# Patient Record
Sex: Male | Born: 1950 | Marital: Married | Smoking: Former smoker
Health system: Southern US, Community
[De-identification: ages and names within clinical notes are randomized; demographics above are authoritative.]

## PROBLEM LIST (undated history)

## (undated) DIAGNOSIS — I8290 Acute embolism and thrombosis of unspecified vein: Secondary | ICD-10-CM

## (undated) DIAGNOSIS — J449 Chronic obstructive pulmonary disease, unspecified: Secondary | ICD-10-CM

## (undated) HISTORY — PX: HERNIA REPAIR: SHX51

---

## 2004-02-23 ENCOUNTER — Ambulatory Visit: Payer: Self-pay | Admitting: Gastroenterology

## 2007-03-02 ENCOUNTER — Ambulatory Visit: Payer: Self-pay | Admitting: Gastroenterology

## 2008-02-25 ENCOUNTER — Emergency Department: Payer: Self-pay | Admitting: Emergency Medicine

## 2015-10-23 ENCOUNTER — Encounter: Payer: Self-pay | Admitting: Emergency Medicine

## 2015-10-23 ENCOUNTER — Ambulatory Visit (INDEPENDENT_AMBULATORY_CARE_PROVIDER_SITE_OTHER): Payer: BLUE CROSS/BLUE SHIELD

## 2015-10-23 ENCOUNTER — Ambulatory Visit
Admission: EM | Admit: 2015-10-23 | Discharge: 2015-10-23 | Disposition: A | Discharge status: Home / Self Care | Payer: BLUE CROSS/BLUE SHIELD | Attending: Family Medicine | Admitting: Family Medicine

## 2015-10-23 DIAGNOSIS — S61219A Laceration without foreign body of unspecified finger without damage to nail, initial encounter: Secondary | ICD-10-CM | POA: Diagnosis not present

## 2015-10-23 DIAGNOSIS — S6710XA Crushing injury of unspecified finger(s), initial encounter: Secondary | ICD-10-CM

## 2015-10-23 HISTORY — DX: Acute embolism and thrombosis of unspecified vein: I82.90

## 2015-10-23 HISTORY — DX: Chronic obstructive pulmonary disease, unspecified: J44.9

## 2015-10-23 MED ORDER — MUPIROCIN 2 % EX OINT: 1.0000 "application " | TOPICAL_OINTMENT | Freq: Three times a day (TID) | CUTANEOUS | 0 refills | Status: DC

## 2015-10-23 MED ORDER — TETANUS-DIPHTH-ACELL PERTUSSIS 5-2.5-18.5 LF-MCG/0.5 IM SUSP
0.5000 mL | Freq: Once | INTRAMUSCULAR | Status: AC
  Administered 2015-10-23: 0.5 mL via INTRAMUSCULAR

## 2015-10-23 NOTE — ED Triage Notes (Signed)
Patient states that a piece of wood and metal cut his left 4th finger. Patient unsure if any piece of wood or metal is up under the skin.  Patient has a laceration to his left 4th finger with minimal bleeding at this time.

## 2015-10-23 NOTE — ED Provider Notes (Signed)
CSN: [id number]     Arrival date & time 10/23/15  1815 History   None    Chief Complaint  Patient presents with  . Finger Injury   (Consider location/radiation/quality/duration/timing/severity/associated sxs/prior Treatment) HPI  This 65 year old right-hand-dominant malewith a crush injury laceration to his fourth still fingertip over the pad. States that it bled very briskly prompted his coming in. He questioned on a cabinet between the truck bed in the cabinet itself. He is not current on his tetanus toxoid     Past Medical History:  Diagnosis Date  . Blood clot in vein   . COPD (chronic obstructive pulmonary disease) (HCC)    Past Surgical History:  Procedure Laterality Date  . HERNIA REPAIR     History reviewed. No pertinent family history. Social History  Substance Use Topics  . Smoking status: Former Games developer  . Smokeless tobacco: Never Used  . Alcohol use No    Review of Systems  Constitutional: Positive for activity change. Negative for appetite change, chills, fatigue and fever.  Skin: Positive for wound. Negative for color change and pallor.  All other systems reviewed and are negative.   Allergies  Review of patient's allergies indicates no known allergies.  Home Medications   Prior to Admission medications   Medication Sig Start Date End Date Taking? Authorizing Provider  albuterol (PROVENTIL HFA;VENTOLIN HFA) 108 (90 Base) MCG/ACT inhaler Inhale 2 puffs into the lungs every 4 (four) hours as needed for wheezing or shortness of breath.   Yes Historical Provider, MD  apixaban (ELIQUIS) 5 MG TABS tablet Take 5 mg by mouth 2 (two) times daily.   Yes Historical Provider, MD  Fluticasone-Salmeterol (ADVAIR) 250-50 MCG/DOSE AEPB Inhale 1 puff into the lungs 2 (two) times daily.   Yes Historical Provider, MD  gabapentin (NEURONTIN) 100 MG capsule Take 100 mg by mouth 3 (three) times daily.   Yes Historical Provider, MD  ipratropium (ATROVENT) 0.02 % nebulizer  solution Take 0.5 mg by nebulization every 4 (four) hours as needed for wheezing or shortness of breath.   Yes Historical Provider, MD  nicotine (NICODERM CQ - DOSED IN MG/24 HOURS) 21 mg/24hr patch Place 21 mg onto the skin daily.   Yes Historical Provider, MD  omeprazole (PRILOSEC) 20 MG capsule Take 20 mg by mouth daily.   Yes Historical Provider, MD  tiotropium (SPIRIVA) 18 MCG inhalation capsule Place 18 mcg into inhaler and inhale daily.   Yes Historical Provider, MD  valACYclovir (VALTREX) 1000 MG tablet Take 1,000 mg by mouth 3 (three) times daily.   Yes Historical Provider, MD  mupirocin ointment (BACTROBAN) 2 % Apply 1 application topically 3 (three) times daily. 10/23/15   Lutricia Feil, PA-C   Meds Ordered and Administered this Visit   Medications  Tdap (BOOSTRIX) injection 0.5 mL (0.5 mLs Intramuscular Given 10/23/15 1939)    BP 136/85 (BP Location: Right Arm)   Pulse 90   Temp 97.5 F (36.4 C) (Tympanic)   Resp 16   Ht 5\' 3"  (1.6 m)   Wt 171 lb (77.6 kg)   SpO2 97%   BMI 30.29 kg/m  No data found.   Physical Exam  Constitutional: He is oriented to person, place, and time. He appears well-developed and well-nourished. No distress.  HENT:  Head: Normocephalic and atraumatic.  Eyes: EOM are normal. Pupils are equal, round, and reactive to light.  Neck: Normal range of motion. Neck supple.  Musculoskeletal: Normal range of motion. He exhibits edema and tenderness. He  exhibits no deformity.  Neurological: He is alert and oriented to person, place, and time.  Skin: Skin is warm and dry. He is not diaphoretic.  Examination of the left dominant ring finger volar pad is a 2 cm laceration in V configuration with the apex proximal. It is skived with the deepest proximal and the flap ending distally. The flap is anchored distally and the viability is questionable but will make a good bio dressing. Flexor tendons are strong and intact. Proximal end is in subcutaneous tissue  gradually elevates to epidermis.  Nursing note and vitals reviewed.   Urgent Care Course   Clinical Course    .Marland Kitchen.Laceration Repair Date/Time: 10/23/2015 8:47 PM Performed by: Lutricia FeilOEMER, WILLIAM P Authorized by: Hassan RowanWADE, EUGENE   Consent:    Consent obtained:  Verbal   Consent given by:  Patient   Risks discussed:  Infection, retained foreign body and poor wound healing   Alternatives discussed:  No treatment Anesthesia (see MAR for exact dosages):    Anesthesia method:  Local infiltration   Local anesthetic:  Lidocaine 1% w/o epi Laceration details:    Location:  Finger   Finger location:  L ring finger   Length (cm):  2   Depth (mm):  2 Repair type:    Repair type:  Simple Pre-procedure details:    Preparation:  Patient was prepped and draped in usual sterile fashion Exploration:    Hemostasis achieved with:  Direct pressure   Wound exploration: wound explored through full range of motion and entire depth of wound probed and visualized     Contaminated: no   Treatment:    Area cleansed with:  Saline   Amount of cleaning:  Extensive   Irrigation solution:  Sterile saline   Irrigation volume:  30 cc   Irrigation method:  Pressure wash   Visualized foreign bodies/material removed: no   Skin repair:    Repair method:  Sutures   Suture size:  4-0   Suture material:  Nylon   Suture technique:  Simple interrupted   Number of sutures:  5 Approximation:    Approximation:  Loose   Vermilion border: well-aligned   Post-procedure details:    Dressing:  Non-adherent dressing, sterile dressing and splint for protection   Patient tolerance of procedure:  Tolerated well, no immediate complications Comments:     She will elevate the hand tonight above his heart. He'll keep the area clean and dry for 24 hours. After 24 hours began a washing program 3 times a day drying thoroughly and applying Bactroban ointment. Symptoms of infection were reviewed with the patient. We'll plan on  removing the sutures in 10 days.   (including critical care time)  Labs Review Labs Reviewed - No data to display  Imaging Review Dg Finger Ring Left  Result Date: 10/23/2015 CLINICAL DATA:  Fourth digit laceration tonight. EXAM: LEFT RING FINGER 2+V COMPARISON:  None. FINDINGS: The joint spaces are maintained. No acute fracture or radiopaque foreign body. Moderate soft tissue swelling involving the distal aspect of the finger. Wood can be extremely difficult to see on plain films. IMPRESSION: No fracture or radiopaque foreign body. Electronically Signed   By: Rudie MeyerP.  Gallerani M.D.   On: 10/23/2015 19:54     Visual Acuity Review  Right Eye Distance:   Left Eye Distance:   Bilateral Distance:    Right Eye Near:   Left Eye Near:    Bilateral Near:    Medications  Tdap (BOOSTRIX) injection 0.5  mL (0.5 mLs Intramuscular Given 10/23/15 1939)       MDM   1. Finger laceration, initial encounter   2. Crush injury to finger, initial encounter    He was fully instructed in signs and symptoms of impending infection. Keep the wound dry for 24 hours. Afterwards he will start a washing program 3 times a day with applying Bactroban ointment 3 times daily until the sutures come out in 10 days. Discharge Medication List as of 10/23/2015  8:38 PM       Lutricia Feil, PA-C 10/23/15 2045    Lutricia Feil, PA-C 10/23/15 2052

## 2015-11-03 ENCOUNTER — Ambulatory Visit
Admission: EM | Admit: 2015-11-03 | Discharge: 2015-11-03 | Disposition: A | Discharge status: Home / Self Care | Payer: BLUE CROSS/BLUE SHIELD

## 2015-11-03 ENCOUNTER — Encounter: Payer: Self-pay | Admitting: *Deleted

## 2015-11-03 DIAGNOSIS — Z4802 Encounter for removal of sutures: Secondary | ICD-10-CM

## 2015-11-03 NOTE — ED Triage Notes (Signed)
Suture removal, 5 sutures removed, no swelling, redness, or drainage observed.

## 2017-09-29 IMAGING — CR DG FINGER RING 2+V*L*
3 series · 3 of 3 positions shown · non-contrast
Comparison: None.

CLINICAL DATA: Fourth digit laceration tonight.

EXAM:
LEFT RING FINGER 2+V

[finger ap]
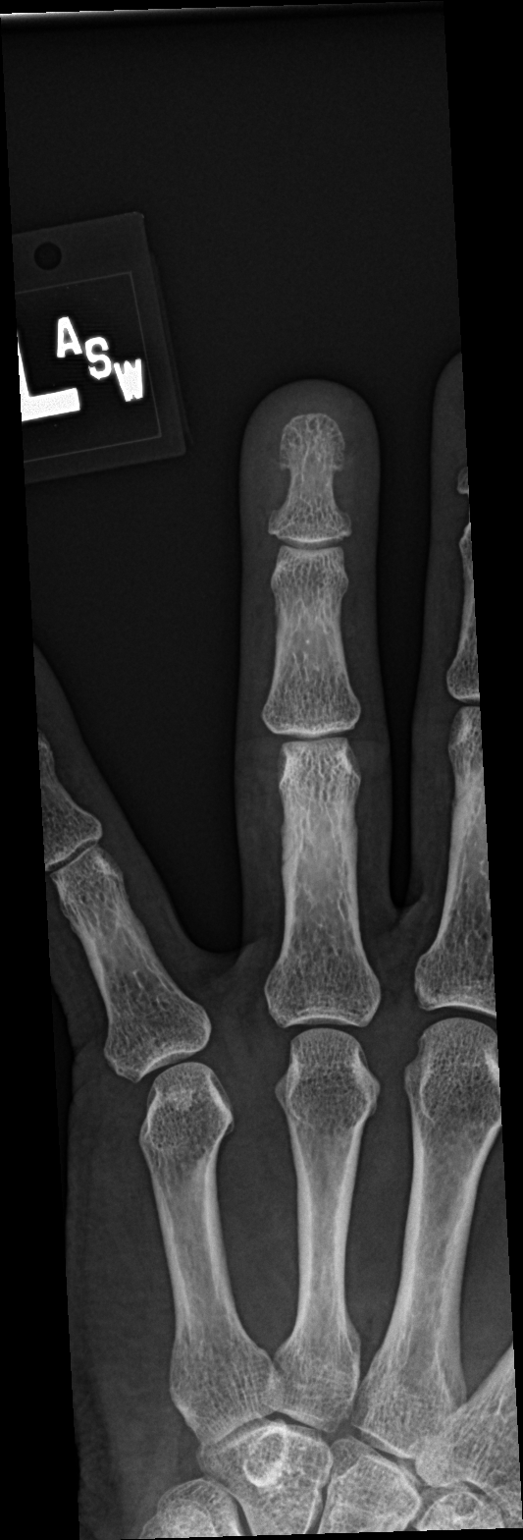

[finger obl]
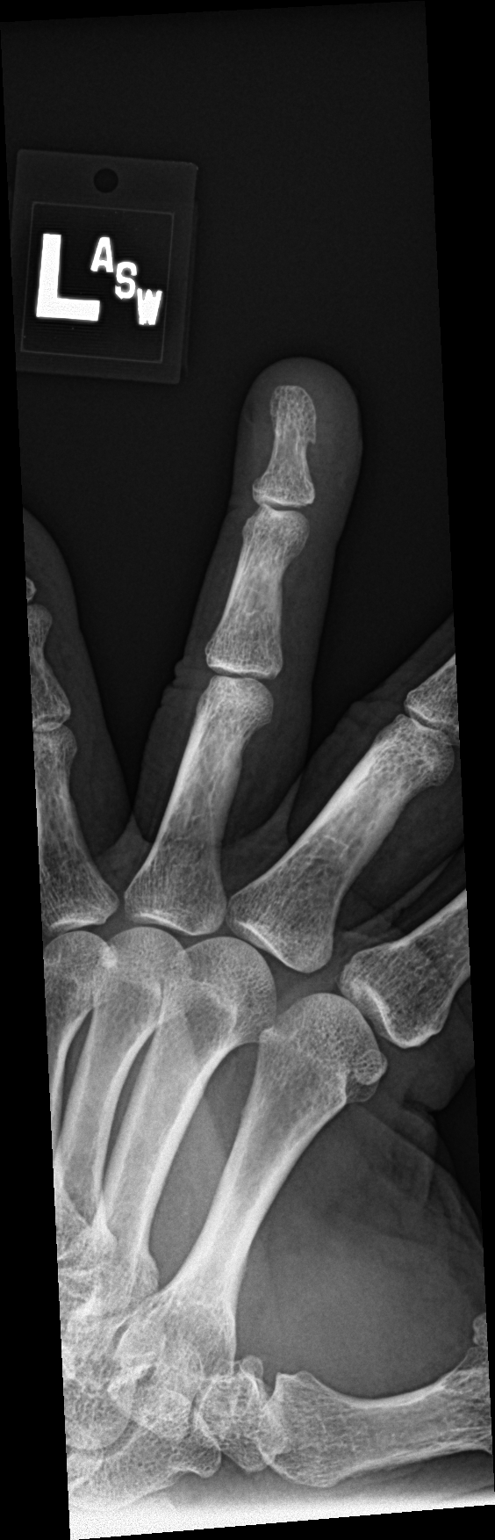

[finger lat]
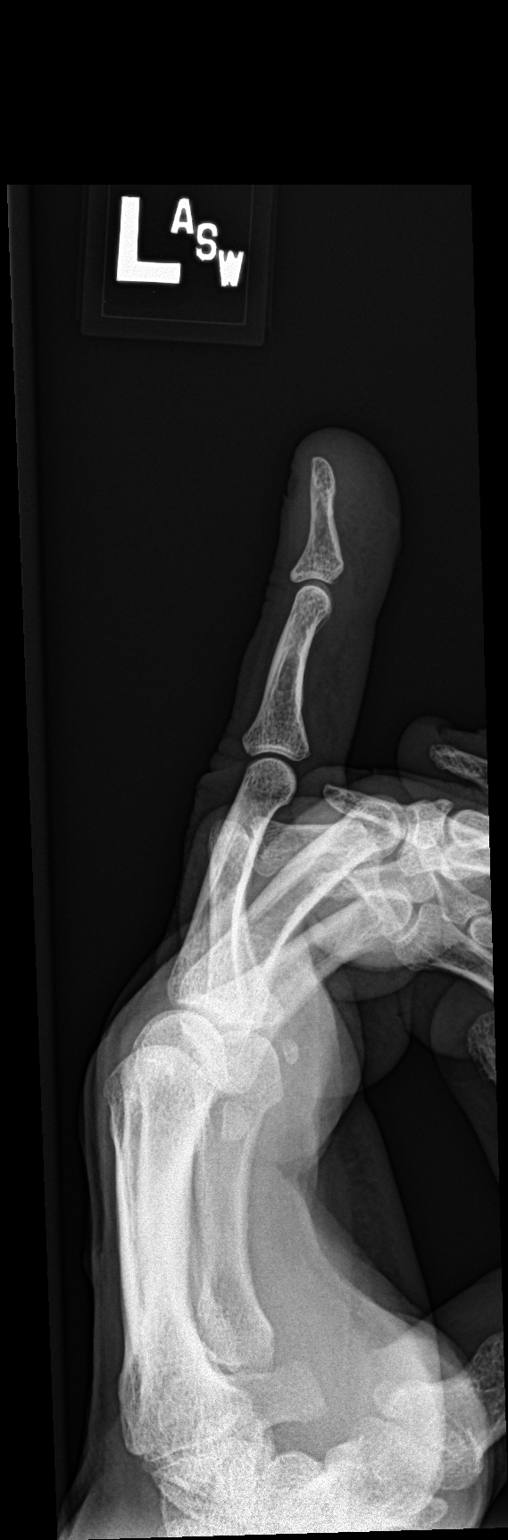

[3 of 3 positions shown; findings below may reference images not displayed]

FINDINGS: The joint spaces are maintained. No acute fracture or radiopaque
foreign body. Moderate soft tissue swelling involving the distal
aspect of the finger. Hary can be extremely difficult to see on
plain films.
IMPRESSION: No fracture or radiopaque foreign body.

## 2020-03-20 ENCOUNTER — Other Ambulatory Visit: Payer: Self-pay

## 2020-03-20 ENCOUNTER — Ambulatory Visit
Admission: EM | Admit: 2020-03-20 | Discharge: 2020-03-20 | Disposition: A | Discharge status: Home / Self Care | Payer: Medicare Other | Attending: Family Medicine | Admitting: Family Medicine

## 2020-03-20 DIAGNOSIS — L03011 Cellulitis of right finger: Secondary | ICD-10-CM

## 2020-03-20 MED ORDER — MUPIROCIN 2 % EX OINT: 1.0000 "application " | TOPICAL_OINTMENT | Freq: Three times a day (TID) | CUTANEOUS | 0 refills | Status: AC

## 2020-03-20 MED ORDER — DOXYCYCLINE HYCLATE 100 MG PO CAPS: 100.0000 mg | ORAL_CAPSULE | Freq: Two times a day (BID) | ORAL | 0 refills | Status: DC

## 2020-03-20 NOTE — ED Provider Notes (Signed)
[facility]    CSN: [id number] Arrival date & time: 03/20/20  1659      History   Chief Complaint Chief Complaint  Patient presents with  . Hand Pain    Right index finger   HPI   70 year old male presents with the above complaint.  Patient reports a 1 month history of pain and redness around his nail bed.  It does not seem to be resolving.  Is not bothersome for him until it is touched or he hits it on something.  It is red and swollen.  Affects the right index finger.  Patient does bite his nails.  This is the likely inciting factor.  No other complaints.   Past Medical History:  Diagnosis Date  . Blood clot in vein   . COPD (chronic obstructive pulmonary disease) (HCC)    Past Surgical History:  Procedure Laterality Date  . HERNIA REPAIR     Home Medications    Prior to Admission medications   Medication Sig Start Date End Date Taking? Authorizing Provider  albuterol (PROVENTIL HFA;VENTOLIN HFA) 108 (90 Base) MCG/ACT inhaler Inhale 2 puffs into the lungs every 4 (four) hours as needed for wheezing or shortness of breath.   Yes [provider]  apixaban (ELIQUIS) 5 MG TABS tablet Take 5 mg by mouth 2 (two) times daily.   Yes [provider]  doxycycline (VIBRAMYCIN) 100 MG capsule Take 1 capsule (100 mg total) by mouth 2 (two) times daily. 03/20/20  Yes Jamauri Kruzel G, DO  Fluticasone-Salmeterol (ADVAIR) 250-50 MCG/DOSE AEPB Inhale 1 puff into the lungs 2 (two) times daily.   Yes [provider]  gabapentin (NEURONTIN) 100 MG capsule Take 100 mg by mouth 3 (three) times daily.   Yes [provider]  ipratropium (ATROVENT) 0.02 % nebulizer solution Take 0.5 mg by nebulization every 4 (four) hours as needed for wheezing or shortness of breath.   Yes [provider]  lisinopril (ZESTRIL) 20 MG tablet Take 20 mg by mouth daily. 01/17/20  Yes [provider]  mupirocin ointment (BACTROBAN) 2 % Apply 1 application  topically 3 (three) times daily for 7 days. 03/20/20 03/27/20 Yes Elisabella Hacker G, DO  rosuvastatin (CRESTOR) 20 MG tablet Take 20 mg by mouth at bedtime. 01/17/20  Yes [provider]  tiotropium (SPIRIVA) 18 MCG inhalation capsule Place 18 mcg into inhaler and inhale daily.   Yes [provider]  valACYclovir (VALTREX) 1000 MG tablet Take 1,000 mg by mouth 3 (three) times daily.   Yes [provider]  nicotine (NICODERM CQ - DOSED IN MG/24 HOURS) 21 mg/24hr patch Place 21 mg onto the skin daily.    [provider]  omeprazole (PRILOSEC) 20 MG capsule Take 20 mg by mouth daily.    [provider]    Family History No family history on file.  Social History Social History   Tobacco Use  . Smoking status: Former Games developer  . Smokeless tobacco: Never Used  Vaping Use  . Vaping Use: Never used  Substance Use Topics  . Alcohol use: No  . Drug use: No     Allergies   Patient has no known allergies.   Review of Systems Review of Systems Per HPI  Physical Exam Triage Vital Signs ED Triage Vitals  Enc Vitals Group     BP 03/20/20 1714 120/85     Pulse Rate 03/20/20 1714 85     Resp 03/20/20 1714 18  Temp 03/20/20 1714 98.2 F (36.8 C)     Temp Source 03/20/20 1714 Oral     SpO2 03/20/20 1714 96 %     Weight 03/20/20 1712 145 lb (65.8 kg)     Height 03/20/20 1712 5\' 3"  (1.6 m)     Head Circumference --      Peak Flow --      Pain Score 03/20/20 1712 10     Pain Loc --      Pain Edu? --      Excl. in GC? --    Updated Vital Signs BP 120/85 (BP Location: Left Arm)   Pulse 85   Temp 98.2 F (36.8 C) (Oral)   Resp 18   Ht 5\' 3"  (1.6 m)   Wt 65.8 kg   SpO2 96%   BMI 25.69 kg/m   Visual Acuity Right Eye Distance:   Left Eye Distance:   Bilateral Distance:    Right Eye Near:   Left Eye Near:    Bilateral Near:     Physical Exam Vitals and nursing note reviewed.  Constitutional:      General: He is not in acute  distress.    Appearance: Normal appearance. He is not ill-appearing.  HENT:     Head: Normocephalic and atraumatic.  Eyes:     General:        Right eye: No discharge.        Left eye: No discharge.     Conjunctiva/sclera: Conjunctivae normal.  Pulmonary:     Effort: Pulmonary effort is normal. No respiratory distress.  Skin:    Comments: Right index finger with erythema and swelling around the nailbed.  Neurological:     Mental Status: He is alert.  Psychiatric:        Mood and Affect: Mood normal.        Behavior: Behavior normal.      UC Treatments / Results  Labs (all labs ordered are listed, but only abnormal results are displayed) Labs Reviewed - No data to display  EKG   Radiology No results found.  Procedures Procedures (including critical care time)  Medications Ordered in UC Medications - No data to display  Initial Impression / Assessment and Plan / UC Course  I have reviewed the triage vital signs and the nursing notes.  Pertinent labs & imaging results that were available during my care of the patient were reviewed by me and considered in my medical decision making (see chart for details).    70 year old male presents with paronychia.  Offered I&D and patient declined.  Placed on Doxy and Bactroban ointment.  Final Clinical Impressions(s) / UC Diagnoses   Final diagnoses:  Paronychia of finger of right hand   Discharge Instructions   None    ED Prescriptions    Medication Sig Dispense Auth. Provider   doxycycline (VIBRAMYCIN) 100 MG capsule Take 1 capsule (100 mg total) by mouth 2 (two) times daily. 14 capsule Tedford Berg G, DO   mupirocin ointment (BACTROBAN) 2 % Apply 1 application topically 3 (three) times daily for 7 days. 30 g 79, DO     PDMP not reviewed this encounter.   03-10-2003, Tommie Sams 03/20/20 1827

## 2020-03-20 NOTE — ED Triage Notes (Signed)
Pt c/o swollen and irritated right index finger, around the nail bed. Pt denies any known injury. Pt points to area that he states was irritated prior to it becoming swollen and painful, about a month ago. Pt states area has increased since that time.

## 2023-06-04 DEATH — deceased
